# Patient Record
Sex: Male | Born: 1990 | Hispanic: Yes | Marital: Single | Smoking: Never smoker
Health system: Southern US, Community
[De-identification: ages and names within clinical notes are randomized; demographics above are authoritative.]

---

## 2015-12-07 ENCOUNTER — Ambulatory Visit: Payer: Self-pay

## 2015-12-07 ENCOUNTER — Other Ambulatory Visit: Payer: Self-pay | Admitting: Occupational Medicine

## 2015-12-07 DIAGNOSIS — M546 Pain in thoracic spine: Secondary | ICD-10-CM

## 2015-12-07 DIAGNOSIS — M545 Low back pain: Secondary | ICD-10-CM

## 2016-02-06 ENCOUNTER — Emergency Department (HOSPITAL_COMMUNITY)
Admission: EM | Admit: 2016-02-06 | Discharge: 2016-02-07 | Disposition: A | Discharge status: Home / Self Care | Payer: Self-pay | Attending: Emergency Medicine | Admitting: Emergency Medicine

## 2016-02-06 ENCOUNTER — Encounter (HOSPITAL_COMMUNITY): Payer: Self-pay | Admitting: Emergency Medicine

## 2016-02-06 DIAGNOSIS — L600 Ingrowing nail: Secondary | ICD-10-CM | POA: Insufficient documentation

## 2016-02-06 DIAGNOSIS — F17200 Nicotine dependence, unspecified, uncomplicated: Secondary | ICD-10-CM | POA: Insufficient documentation

## 2016-02-06 MED ORDER — IBUPROFEN 400 MG PO TABS
600.0000 mg | ORAL_TABLET | Freq: Once | ORAL | Status: AC
  Administered 2016-02-06: 600 mg via ORAL
  Filled 2016-02-06: qty 1

## 2016-02-06 MED ORDER — LIDOCAINE HCL (PF) 1 % IJ SOLN
5.0000 mL | Freq: Once | INTRAMUSCULAR | Status: AC
  Administered 2016-02-06: 5 mL
  Filled 2016-02-06: qty 5

## 2016-02-06 MED ORDER — SULFAMETHOXAZOLE-TRIMETHOPRIM 800-160 MG PO TABS
1.0000 | ORAL_TABLET | Freq: Once | ORAL | Status: AC
  Administered 2016-02-07: 1 via ORAL
  Filled 2016-02-06: qty 1

## 2016-02-06 MED ORDER — HYDROCODONE-ACETAMINOPHEN 5-325 MG PO TABS
1.0000 | ORAL_TABLET | Freq: Once | ORAL | Status: AC
  Administered 2016-02-07: 1 via ORAL
  Filled 2016-02-06: qty 1

## 2016-02-06 MED ORDER — NAPROXEN 500 MG PO TABS: 500.0000 mg | ORAL_TABLET | Freq: Two times a day (BID) | ORAL | 0 refills | Status: AC

## 2016-02-06 MED ORDER — HYDROCODONE-ACETAMINOPHEN 5-325 MG PO TABS: 1.0000 | ORAL_TABLET | Freq: Four times a day (QID) | ORAL | 0 refills | Status: AC | PRN

## 2016-02-06 MED ORDER — SULFAMETHOXAZOLE-TRIMETHOPRIM 800-160 MG PO TABS: 1.0000 | ORAL_TABLET | Freq: Two times a day (BID) | ORAL | 0 refills | Status: AC

## 2016-02-06 NOTE — ED Triage Notes (Signed)
Patient with great toe infection and pain on the right foot.  Area is swollen with some drainage from nail edge.  Patient was trying to cut his toe nails.

## 2016-02-06 NOTE — ED Provider Notes (Signed)
[facility]-EMERGENCY DEPT Provider Note   CSN: [id number] Arrival date & time: 02/06/16  2031  By signing my name below, I, Phillis Haggis, attest that this documentation has been prepared under the direction and in the presence of [facility], NP-C. Electronically Signed: Phillis Haggis, ED Scribe. 02/06/16. 9:33 PM.  History   Chief Complaint Chief Complaint  Patient presents with  . Nail Problem   The history is provided by the patient. No language interpreter was used.  Toe Pain  This is a new problem. The current episode started more than 1 week ago. The problem has been gradually worsening. The symptoms are aggravated by walking. He has tried a warm compress for the symptoms. The treatment provided mild relief.   HPI Comments: Derek Ashley is a 25 y.o. male who presents to the Emergency Department complaining of gradually worsening left great toe pain onset one week ago. Pt says that he was trying to cut his toe nails when the pain started. There was a hangnail present and pt tried to remove it. He is now having associated swelling and drainage around the toenail which started 3 days ago. Pt currently rates his pain 9/10. He has been using warm soaks on the foot and ibuprofen to no relief. He denies fever, chills, nausea, vomiting, numbness, or weakness.   History reviewed. No pertinent past medical history.  There are no active problems to display for this patient.   History reviewed. No pertinent surgical history.   Home Medications    Prior to Admission medications   Medication Sig Start Date End Date Taking? Authorizing Provider  HYDROcodone-acetaminophen (NORCO) 5-325 MG tablet Take 1 tablet by mouth every 6 (six) hours as needed. 02/06/16   Hope Orlene Och, NP  naproxen (NAPROSYN) 500 MG tablet Take 1 tablet (500 mg total) by mouth 2 (two) times daily. 02/06/16   Hope Orlene Och, NP  sulfamethoxazole-trimethoprim (BACTRIM DS,SEPTRA DS) 800-160 MG tablet Take 1 tablet by mouth 2  (two) times daily. 02/06/16 02/13/16  Hope Orlene Och, NP    Family History No family history on file.  Social History Social History  Substance Use Topics  . Smoking status: Current Every Day Smoker  . Smokeless tobacco: Never Used  . Alcohol use Yes     Allergies   Review of patient's allergies indicates no known allergies.   Review of Systems Review of Systems  Constitutional: Negative for chills and fever.  Gastrointestinal: Negative for nausea and vomiting.  Musculoskeletal: Positive for arthralgias.  Skin: Positive for wound. Negative for color change.  Neurological: Negative for weakness and numbness.   Physical Exam Updated Vital Signs BP 111/62 (BP Location: Right Arm)   Pulse 73   Temp 99.1 F (37.3 C) (Oral)   Resp 18   SpO2 95%   Physical Exam  Constitutional: He is oriented to person, place, and time. He appears well-developed and well-nourished.  HENT:  Head: Normocephalic and atraumatic.  Mouth/Throat: No oropharyngeal exudate.  Eyes: Conjunctivae and EOM are normal. Pupils are equal, round, and reactive to light.  Neck: Normal range of motion. Neck supple.  Musculoskeletal: Normal range of motion.  Tenderness and swelling surrounding the nail of the left great toe. There is a partial ingrown nail where the patient has tried to remove it. There is a small amount of purulent drainage. Adequate circulation; pedal pulses 2+. Erythema, edema, and purulent drainage noted around the nail.  Neurological: He is alert and oriented to person, place, and time.  Skin:  Skin is warm and dry.  Psychiatric: He has a normal mood and affect. His behavior is normal.  Nursing note and vitals reviewed.    ED Treatments / Results  DIAGNOSTIC STUDIES: Oxygen Saturation is 99% on RA, normal by my interpretation.    COORDINATION OF CARE: 9:28 PM-Discussed treatment plan which includes excision of ingrown toenail with pt at bedside and pt agreed to plan. Although nurse's  note states that the infected toe is on the right foot, upon examination, the infection is on the left foot.   11:55 PM- pt is not utd on tdap. Will administer tdap booster.   Labs (all labs ordered are listed, but only abnormal results are displayed) Labs Reviewed - No data to display   Radiology No results found.  Procedures Excise ingrown toenail Date/Time: 02/06/2016 9:31 PM Performed by: Janne NapoleonNEESE, HOPE M Authorized by: Janne NapoleonNEESE, HOPE M  Consent: Verbal consent obtained. Risks and benefits: risks, benefits and alternatives were discussed Consent given by: patient Patient understanding: patient states understanding of the procedure being performed Patient identity confirmed: verbally with patient Preparation: Patient was prepped and draped in the usual sterile fashion. Local anesthesia used: yes Anesthesia: local infiltration  Anesthesia: Local anesthesia used: yes Local Anesthetic: lidocaine 1% without epinephrine and bupivacaine 0.25% without epinephrine Anesthetic total: 6 mL  Sedation: Patient sedated: no Patient tolerance: Patient tolerated the procedure well with no immediate complications      Medications Ordered in ED Medications  ibuprofen (ADVIL,MOTRIN) tablet 600 mg (600 mg Oral Given 02/06/16 2130)  lidocaine (PF) (XYLOCAINE) 1 % injection 5 mL (5 mLs Infiltration Given 02/06/16 2136)  sulfamethoxazole-trimethoprim (BACTRIM DS,SEPTRA DS) 800-160 MG per tablet 1 tablet (1 tablet Oral Given 02/07/16 0026)  HYDROcodone-acetaminophen (NORCO/VICODIN) 5-325 MG per tablet 1 tablet (1 tablet Oral Given 02/07/16 0026)   Initial Impression / Assessment and Plan / ED Course  I have reviewed the triage vital signs and the nursing notes.  Pertinent labs & imaging results that were available during my care of the patient were reviewed by me and considered in my medical decision making (see chart for details).  Clinical Course    Final Clinical Impressions(s) / ED Diagnoses     Final diagnoses:  Ingrown toenail   I personally performed the services described in this documentation, which was scribed in my presence. The recorded information has been reviewed and is accurate.   New Prescriptions Discharge Medication List as of 02/06/2016 11:59 PM    START taking these medications   Details  HYDROcodone-acetaminophen (NORCO) 5-325 MG tablet Take 1 tablet by mouth every 6 (six) hours as needed., Starting Wed 02/06/2016, Print    naproxen (NAPROSYN) 500 MG tablet Take 1 tablet (500 mg total) by mouth 2 (two) times daily., Starting Wed 02/06/2016, Print    sulfamethoxazole-trimethoprim (BACTRIM DS,SEPTRA DS) 800-160 MG tablet Take 1 tablet by mouth 2 (two) times daily., Starting Wed 02/06/2016, Until Wed 02/13/2016, [address]Print         Hope [place], NP 02/07/16 [id number]    Alvira MondayErin Schlossman, MD 02/07/16 1308

## 2016-02-06 NOTE — ED Notes (Signed)
NP at bedside.

## 2016-08-06 ENCOUNTER — Encounter (HOSPITAL_COMMUNITY): Payer: Self-pay | Admitting: Emergency Medicine

## 2016-08-06 ENCOUNTER — Emergency Department (HOSPITAL_COMMUNITY): Payer: Self-pay

## 2016-08-06 ENCOUNTER — Encounter (HOSPITAL_COMMUNITY): Payer: Self-pay | Admitting: *Deleted

## 2016-08-06 ENCOUNTER — Emergency Department (HOSPITAL_COMMUNITY)
Admission: EM | Admit: 2016-08-06 | Discharge: 2016-08-06 | Disposition: A | Discharge status: Home / Self Care | Payer: Self-pay | Attending: Dermatology | Admitting: Dermatology

## 2016-08-06 ENCOUNTER — Emergency Department (HOSPITAL_COMMUNITY)
Admission: EM | Admit: 2016-08-06 | Discharge: 2016-08-06 | Disposition: A | Discharge status: Home / Self Care | Payer: Self-pay | Attending: Emergency Medicine | Admitting: Emergency Medicine

## 2016-08-06 ENCOUNTER — Encounter (HOSPITAL_COMMUNITY): Payer: Self-pay

## 2016-08-06 DIAGNOSIS — L0231 Cutaneous abscess of buttock: Secondary | ICD-10-CM | POA: Insufficient documentation

## 2016-08-06 DIAGNOSIS — K603 Anal fistula: Secondary | ICD-10-CM | POA: Insufficient documentation

## 2016-08-06 DIAGNOSIS — F17200 Nicotine dependence, unspecified, uncomplicated: Secondary | ICD-10-CM | POA: Insufficient documentation

## 2016-08-06 DIAGNOSIS — Z5321 Procedure and treatment not carried out due to patient leaving prior to being seen by health care provider: Secondary | ICD-10-CM | POA: Insufficient documentation

## 2016-08-06 LAB — CBC WITH DIFFERENTIAL/PLATELET
Basophils Absolute: 0 10*3/uL (ref 0.0–0.1)
Basophils Relative: 0 %
Eosinophils Absolute: 0.2 10*3/uL (ref 0.0–0.7)
Eosinophils Relative: 2 %
HCT: 44.4 % (ref 39.0–52.0)
Hemoglobin: 14.7 g/dL (ref 13.0–17.0)
Lymphocytes Relative: 30 %
Lymphs Abs: 3 10*3/uL (ref 0.7–4.0)
MCH: 29.5 pg (ref 26.0–34.0)
MCHC: 33.1 g/dL (ref 30.0–36.0)
MCV: 89.2 fL (ref 78.0–100.0)
Monocytes Absolute: 0.7 10*3/uL (ref 0.1–1.0)
Monocytes Relative: 7 %
Neutro Abs: 6.2 10*3/uL (ref 1.7–7.7)
Neutrophils Relative %: 61 %
Platelets: 335 10*3/uL (ref 150–400)
RBC: 4.98 MIL/uL (ref 4.22–5.81)
RDW: 12.7 % (ref 11.5–15.5)
WBC: 10.1 10*3/uL (ref 4.0–10.5)

## 2016-08-06 LAB — I-STAT CHEM 8, ED
BUN: 12 mg/dL (ref 6–20)
Calcium, Ion: 1.19 mmol/L (ref 1.15–1.40)
Chloride: 100 mmol/L — ABNORMAL LOW (ref 101–111)
Creatinine, Ser: 0.9 mg/dL (ref 0.61–1.24)
Glucose, Bld: 85 mg/dL (ref 65–99)
HCT: 47 % (ref 39.0–52.0)
Hemoglobin: 16 g/dL (ref 13.0–17.0)
Potassium: 4.4 mmol/L (ref 3.5–5.1)
Sodium: 139 mmol/L (ref 135–145)
TCO2: 31 mmol/L (ref 0–100)

## 2016-08-06 MED ORDER — IOPAMIDOL (ISOVUE-300) INJECTION 61%
INTRAVENOUS | Status: AC
  Administered 2016-08-06: 75 mL
  Filled 2016-08-06: qty 75

## 2016-08-06 MED ORDER — SULFAMETHOXAZOLE-TRIMETHOPRIM 800-160 MG PO TABS: 1.0000 | ORAL_TABLET | Freq: Two times a day (BID) | ORAL | 0 refills | Status: AC

## 2016-08-06 MED ORDER — CEPHALEXIN 500 MG PO CAPS: 1000.0000 mg | ORAL_CAPSULE | Freq: Two times a day (BID) | ORAL | 0 refills | Status: AC

## 2016-08-06 MED ORDER — LIDOCAINE-EPINEPHRINE (PF) 2 %-1:200000 IJ SOLN
10.0000 mL | Freq: Once | INTRAMUSCULAR | Status: DC
  Filled 2016-08-06: qty 20

## 2016-08-06 NOTE — ED Triage Notes (Signed)
Pt in c/o abscess to his buttocks that he has had surgery on over a year ago. Pt reports that this never really healed and he has ongoing issues with it.

## 2016-08-06 NOTE — ED Provider Notes (Signed)
[facility]EMER[facility]Y DEPT Provider Note   CSN: [id number] Arrival date & time: 08/06/16  1440   By signing my name below, I, Avnee Patel, attest that this documentation has been prepared under the direction and in the presence of  [employer], PA-C. Electronically Signed: Clovis Pu, ED Scribe. 08/06/16. 4:23 PM.   History   Chief Complaint Chief Complaint  Patient presents with  . Abscess    HPI Comments:  Derek Ashley is a 26 y.o. male who presents to the Emergency Department complaining of acute onset, "9/10", sharp pain with associated redness and swelling to his buttocks x 3 days. Pt states he has a hx of similar symptoms to this area for which he has surgery about 1.5 years ago but notes it never completely resolved and will intermittently drain.  He notes this area of swelling has been draining malodorous fluid, pus and minimal blood. His pain is worse with ambulation and when sitting, as well as any sort of increased pressure such as sneezing and with bowel movements.. Pt also reports subjective fevers and 1 episode of non-bloody vomiting yesterday. Pt has been able to keep food down since then. No alleviating factors noted at pt has not tried anything for his symptoms. Pt denies chest pain, SOB, abdominal pain, nausea, diarrhea, hematemesis or any other associated symptoms. No other complaints noted.     The history is provided by the patient and a relative. No language interpreter was used.    History reviewed. No pertinent past medical history.  There are no active problems to display for this patient.   History reviewed. No pertinent surgical history.     Home Medications    Prior to Admission medications   Medication Sig Start Date End Date Taking? Authorizing Provider  cephALEXin (KEFLEX) 500 MG capsule Take 2 capsules (1,000 mg total) by mouth 2 (two) times daily. 08/06/16 08/16/16  Shan Padgett A Cassundra Mckeever, PA-C  HYDROcodone-acetaminophen (NORCO) 5-325 MG tablet Take 1 tablet by  mouth every 6 (six) hours as needed. 02/06/16   Hope Orlene Och, NP  naproxen (NAPROSYN) 500 MG tablet Take 1 tablet (500 mg total) by mouth 2 (two) times daily. 02/06/16   Hope Orlene Och, NP  sulfamethoxazole-trimethoprim (BACTRIM DS,SEPTRA DS) 800-160 MG tablet Take 1 tablet by mouth 2 (two) times daily. 08/06/16 08/16/16  Jeanie Sewer, PA-C    Family History History reviewed. No pertinent family history.  Social History Social History  Substance Use Topics  . Smoking status: Current Every Day Smoker  . Smokeless tobacco: Never Used  . Alcohol use Yes     Allergies   Patient has no known allergies.   Review of Systems Review of Systems  Constitutional: Positive for fever (subjective).  Respiratory: Negative for shortness of breath.   Cardiovascular: Negative for chest pain.  Gastrointestinal: Positive for vomiting. Negative for abdominal pain, diarrhea and nausea.  Genitourinary: Negative for genital sores and testicular pain.  Skin: Positive for color change.     Physical Exam Updated Vital Signs BP 123/71 (BP Location: Left Arm)   Pulse 66   Temp 98.9 F (37.2 C) (Oral)   Resp 20   SpO2 96%   Physical Exam  Constitutional: He is oriented to person, place, and time. He appears well-developed and well-nourished. No distress.  Sitting comfortably in chair, in no apparent distress  HENT:  Head: Normocephalic and atraumatic.  Right Ear: External ear normal.  Left Ear: External ear normal.  Eyes: Conjunctivae and EOM are normal. Right  eye exhibits no discharge. Left eye exhibits no discharge. No scleral icterus.  Neck: Normal range of motion. Neck supple. No JVD present. No tracheal deviation present.  Cardiovascular: Normal rate, regular rhythm, normal heart sounds and intact distal pulses.   Pulmonary/Chest: Effort normal and breath sounds normal.  Abdominal: Soft. Bowel sounds are normal. He exhibits no distension. There is no tenderness.  Genitourinary: Prostate normal.  Rectal exam shows no external hemorrhoid and no internal hemorrhoid.     Genitourinary Comments: 2mm opening on left glute distal to the anus, tender to palpation with surrounding eythema, draining minimal serosanguinous drainage on palpation. No obvious purulence. No fluctuance appreciated. No frank anal bleeding, no visible external hemorrhoids or palpable internal hemorrhoids, left rectal wall tender to palpation. No rectal masses noted. Inferior pole of prostate does not feel enlarged or boggy.   Musculoskeletal: Normal range of motion.  Neurological: He is alert and oriented to person, place, and time.  Skin: Skin is warm and dry. Capillary refill takes less than 2 seconds. He is not diaphoretic.  Psychiatric: He has a normal mood and affect. His behavior is normal. Judgment and thought content normal.     ED Treatments / Results  DIAGNOSTIC STUDIES:  Oxygen Saturation is 100% on RA, normal by my interpretation.    COORDINATION OF CARE:  4:19 PM Discussed treatment plan with pt at bedside and pt agreed to plan.  Labs (all labs ordered are listed, but only abnormal results are displayed) Labs Reviewed  I-STAT CHEM 8, ED - Abnormal; Notable for the following:       Result Value   Chloride 100 (*)    All other components within normal limits  CBC WITH DIFFERENTIAL/PLATELET    EKG  EKG Interpretation None       Radiology Ct Pelvis W Contrast  Result Date: 08/06/2016 CLINICAL DATA:  Perirectal pain with diarrhea EXAM: CT PELVIS WITH CONTRAST TECHNIQUE: Multidetector CT imaging of the pelvis was performed using the standard protocol following the bolus administration of intravenous contrast. CONTRAST:  75mL ISOVUE-300 IOPAMIDOL (ISOVUE-300) INJECTION 61% COMPARISON:  None. FINDINGS: Urinary Tract:  No abnormality visualized. Bowel: At the anterolateral aspect of the anus, on the left, there is an area of soft tissue density measuring 1.8 x 2.4 cm with an associated sinus tract  that extends posteriorly and inferiorly to the skin surface at the medial aspect of the left buttocks. There is no abscess or drainable fluid collection. The visualized colon and small bowel are normal. The appendix is normal. Vascular/Lymphatic: No pathologically enlarged lymph nodes. No significant vascular abnormality seen. Reproductive:  Normal prostate and seminal vesicles. Other:  None. Musculoskeletal: Lower lumbar facet arthrosis. No spinal canal stenosis. IMPRESSION: Soft tissue attenuation focus at the left anterolateral aspect of the venous with associated sinus tract extending to the skin surface at the medial left buttocks, possibly indicating a perianal fistula. Correlation with direct examination is recommended. Additionally, high-resolution MR imaging of the pelvis might be helpful. Electronically Signed   By: Deatra Robinson M.D.   On: 08/06/2016 18:16    Procedures Procedures (including critical care time)  Medications Ordered in ED Medications  lidocaine-EPINEPHrine (XYLOCAINE W/EPI) 2 %-1:200000 (PF) injection 10 mL (not administered)  iopamidol (ISOVUE-300) 61 % injection (75 mLs  Contrast Given 08/06/16 1747)     Initial Impression / Assessment and Plan / ED Course  I have reviewed the triage vital signs and the nursing notes.  Pertinent labs & imaging results that were available  during my care of the patient were reviewed by me and considered in my medical decision making (see chart for details).     26yom presents with 3 day history of worsening perirectal pain and drainage. Recurrent issue. Pt afebrile, VSS, and in NAD. No observable hemorrhoids, prostate tenderness/bogginess, or rectal bleeding on exam. Small opening of skin with surrounding erythema, no purulent drainage or fluctuance noted. Obtained CT to evaluate for perirectal abscess, which showed known perianal fistula with no abscess or drainable fluid collection. Low suspicion of acute abdominopelvic pathology  requiring emergent surgical intervention. Effected area is not amenable to I&D. Will treat for cellulitis with the surrounding erythema and spoke with general surgery, who recommends pt call to make an appt tomorrow for evaluation of surgical candidacy. Rx'd keflex and bactrim x 10 days. He will return here in 48 hours for re-check. Discussed with pt, who verbalized understanding of and agreement with plan. Discussed ED return precautions. Pt stable for discharge home.   Final Clinical Impressions(s) / ED Diagnoses   Final diagnoses:  Perianal fistula    New Prescriptions Discharge Medication List as of 08/06/2016  6:47 PM    START taking these medications   Details  cephALEXin (KEFLEX) 500 MG capsule Take 2 capsules (1,000 mg total) by mouth 2 (two) times daily., Starting Wed 08/06/2016, Until Sat 08/16/2016, Print    sulfamethoxazole-trimethoprim (BACTRIM DS,SEPTRA DS) 800-160 MG tablet Take 1 tablet by mouth 2 (two) times daily., Starting Wed 08/06/2016, Until Sat 08/16/2016, Print      I personally performed the services described in this documentation, which was scribed in my presence. The recorded information has been reviewed and is accurate.     Jeanie Sewer, PA-C 08/06/16 1905    Nira Conn, MD 08/06/16 2117

## 2016-08-06 NOTE — ED Triage Notes (Signed)
Patient with abscess of the left buttock clef.  Patient continues to have oozing and odor from the area.  Translator states that he had the I&D a year ago and that it really has not healed.

## 2016-08-06 NOTE — ED Triage Notes (Signed)
Pt complains of a abscess on his buttocks that is draining and sore, hx of the same

## 2016-08-06 NOTE — ED Notes (Signed)
Returned from xray

## 2016-08-06 NOTE — ED Notes (Signed)
IV attempt x2 without success.

## 2016-08-06 NOTE — ED Notes (Signed)
Pt left. 

## 2020-09-18 ENCOUNTER — Encounter (HOSPITAL_COMMUNITY): Payer: Self-pay

## 2020-09-18 DIAGNOSIS — S5012XA Contusion of left forearm, initial encounter: Secondary | ICD-10-CM

## 2020-10-02 ENCOUNTER — Emergency Department (HOSPITAL_COMMUNITY)
Admission: EM | Admit: 2020-10-02 | Discharge: 2020-10-02 | Disposition: A | Discharge status: Home / Self Care | Payer: Self-pay | Attending: Emergency Medicine | Admitting: Emergency Medicine

## 2020-10-02 ENCOUNTER — Encounter (HOSPITAL_COMMUNITY): Payer: Self-pay

## 2020-10-02 ENCOUNTER — Other Ambulatory Visit: Payer: Self-pay

## 2020-10-02 ENCOUNTER — Emergency Department (HOSPITAL_COMMUNITY): Payer: Self-pay

## 2020-10-02 DIAGNOSIS — S8001XA Contusion of right knee, initial encounter: Secondary | ICD-10-CM | POA: Insufficient documentation

## 2020-10-02 DIAGNOSIS — W19XXXA Unspecified fall, initial encounter: Secondary | ICD-10-CM | POA: Insufficient documentation

## 2020-10-02 DIAGNOSIS — Y9364 Activity, baseball: Secondary | ICD-10-CM | POA: Insufficient documentation

## 2020-10-02 MED ORDER — IBUPROFEN 400 MG PO TABS
600.0000 mg | ORAL_TABLET | Freq: Once | ORAL | Status: AC
  Administered 2020-10-02: 600 mg via ORAL
  Filled 2020-10-02: qty 1

## 2020-10-02 NOTE — ED Triage Notes (Signed)
Pt presents with Right knee pain. Injured the area yesterday while playing ball

## 2020-10-02 NOTE — ED Provider Notes (Signed)
[facility] EMERGENCY DEPARTMENT Provider Note   CSN: [id number] Arrival date & time: 10/02/20  1011     History Chief Complaint  Patient presents with  . Knee Pain    Derek Ashley is a 30 y.o. male.  Patient presents with right knee pain worse with positions and walking.  Patient fell on the right right lateral knee yesterday playing baseball.  No other injuries.  Mild swelling pain with walking.  No fever or infectious symptoms.        History reviewed. No pertinent past medical history.  There are no problems to display for this patient.   No past surgical history on file.     Family History  Problem Relation Age of Onset  . Hypertension Mother   . Hypertension Father     Social History   Tobacco Use  . Smoking status: Never Smoker  . Smokeless tobacco: Never Used  Vaping Use  . Vaping Use: Some days  . Substances: Nicotine, THC  Substance Use Topics  . Alcohol use: Yes  . Drug use: Yes    Types: Marijuana    Home Medications Prior to Admission medications   Medication Sig Start Date End Date Taking? Authorizing Provider  HYDROcodone-acetaminophen (NORCO) 5-325 MG tablet Take 1 tablet by mouth every 6 (six) hours as needed. 02/06/16   Janne Napoleon, NP  naproxen (NAPROSYN) 500 MG tablet Take 1 tablet (500 mg total) by mouth 2 (two) times daily. 02/06/16   Janne Napoleon, NP    Allergies    Patient has no known allergies.  Review of Systems   Review of Systems  Constitutional: Negative for chills and fever.  HENT: Negative for congestion.   Respiratory: Negative for shortness of breath.   Cardiovascular: Negative for chest pain.  Gastrointestinal: Negative for abdominal pain and vomiting.  Genitourinary: Negative for flank pain.  Musculoskeletal: Positive for gait problem and joint swelling. Negative for back pain, neck pain and neck stiffness.  Skin: Negative for rash.  Neurological: Negative for light-headedness and  headaches.    Physical Exam Updated Vital Signs BP 133/90   Pulse 79   Temp 98.9 F (37.2 C) (Oral)   Resp 16   SpO2 97%   Physical Exam Vitals and nursing note reviewed.  Constitutional:      Appearance: He is well-developed.  HENT:     Head: Normocephalic and atraumatic.  Eyes:     General:        Right eye: No discharge.        Left eye: No discharge.     Conjunctiva/sclera: Conjunctivae normal.  Neck:     Trachea: No tracheal deviation.  Cardiovascular:     Rate and Rhythm: Normal rate.  Pulmonary:     Effort: Pulmonary effort is normal.     Breath sounds: Normal breath sounds.  Musculoskeletal:        General: Swelling and tenderness present. No deformity.     Cervical back: Normal range of motion.     Comments: Patient presents with right lateral knee tenderness on exam, minimal swelling, no joint effusion, pain with flexion, no distal or proximal tenderness, primarily at anterior lateral joint line.  No obvious ligament laxity.  Compartments soft.  Skin:    General: Skin is warm.     Findings: No rash.  Neurological:     Mental Status: He is alert and oriented to person, place, and time.     ED Results / Procedures /  Treatments   Labs (all labs ordered are listed, but only abnormal results are displayed) Labs Reviewed - No data to display  EKG None  Radiology DG Knee Complete 4 Views Right  Result Date: 10/02/2020 CLINICAL DATA:  Right knee pain. EXAM: RIGHT KNEE - COMPLETE 4+ VIEW COMPARISON:  No prior. FINDINGS: No evidence of fracture, dislocation, or joint effusion. No evidence of arthropathy or other focal bone abnormality. Soft tissues are unremarkable. IMPRESSION: No acute abnormality identified. No evidence of fracture or dislocation. Electronically Signed   By: Maisie Fus  Register   On: 10/02/2020 10:55    Procedures Procedures   Medications Ordered in ED Medications  ibuprofen (ADVIL) tablet 600 mg (600 mg Oral Given 10/02/20 1340)    ED  Course  I have reviewed the triage vital signs and the nursing notes.  Pertinent labs & imaging results that were available during my care of the patient were reviewed by me and considered in my medical decision making (see chart for details).    MDM Rules/Calculators/A&P                          Patient presents with isolated right knee injury, x-ray reviewed no acute fracture.  Discussed concern for ligamentous/meniscal injury, Ace wrap, pain meds and supportive care.  Final Clinical Impression(s) / ED Diagnoses Final diagnoses:  Contusion of right knee, initial encounter    Rx / DC Orders ED Discharge Orders    None       Blane Ohara, MD 10/02/20 1344

## 2020-10-02 NOTE — Discharge Instructions (Signed)
Use ice, tylenol and ibuprofen every 6 hrs as needed for pain.

## 2020-10-02 NOTE — ED Provider Notes (Signed)
Emergency Medicine Provider Triage Evaluation Note  Derek Ashley , a 30 y.o. male  was evaluated in triage.  Pt complains of right knee injury yesterday.  Review of Systems  Positive: Knee pain Negative: Neuro deficits  Physical Exam  BP 131/81 (BP Location: Left Arm)   Pulse 88   Temp 98.9 F (37.2 C) (Oral)   Resp 16   SpO2 97%  Gen:   Awake, no distress   Resp:  Normal effort  MSK:   Moves extremities without difficulty  Other:    Medical Decision Making  Medically screening exam initiated at 10:21 AM.  Appropriate orders placed.  Derek Ashley was informed that the remainder of the evaluation will be completed by another provider, this initial triage assessment does not replace that evaluation, and the importance of remaining in the ED until their evaluation is complete.     Anselm Pancoast, PA-C 10/02/20 1022    Jacalyn Lefevre, MD 10/02/20 1042
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
